# Patient Record
Sex: Female | Born: 1975 | Race: White | Hispanic: No | Marital: Single | Smoking: Current every day smoker
Health system: Southern US, Community
[De-identification: ages and names within clinical notes are randomized; demographics above are authoritative.]

## PROBLEM LIST (undated history)

## (undated) DIAGNOSIS — E282 Polycystic ovarian syndrome: Secondary | ICD-10-CM

## (undated) HISTORY — PX: REDUCTION MAMMAPLASTY: SUR839

## (undated) HISTORY — PX: BREAST REDUCTION SURGERY: SHX8

## (undated) HISTORY — DX: Polycystic ovarian syndrome: E28.2

---

## 2007-01-17 ENCOUNTER — Ambulatory Visit: Payer: Self-pay | Admitting: Internal Medicine

## 2007-01-19 ENCOUNTER — Ambulatory Visit: Payer: Self-pay | Admitting: Internal Medicine

## 2007-05-03 DIAGNOSIS — D239 Other benign neoplasm of skin, unspecified: Secondary | ICD-10-CM | POA: Insufficient documentation

## 2008-09-09 ENCOUNTER — Ambulatory Visit: Payer: Self-pay | Admitting: Internal Medicine

## 2008-10-16 ENCOUNTER — Ambulatory Visit: Payer: Self-pay | Admitting: Internal Medicine

## 2008-10-17 ENCOUNTER — Ambulatory Visit: Payer: Self-pay | Admitting: *Deleted

## 2011-03-19 ENCOUNTER — Other Ambulatory Visit (HOSPITAL_COMMUNITY)
Admission: RE | Admit: 2011-03-19 | Discharge: 2011-03-19 | Disposition: A | Discharge status: Home / Self Care | Payer: BC Managed Care – PPO | Source: Ambulatory Visit | Attending: Family Medicine | Admitting: Family Medicine

## 2011-03-19 ENCOUNTER — Other Ambulatory Visit: Payer: Self-pay | Admitting: Physician Assistant

## 2011-03-19 DIAGNOSIS — Z01419 Encounter for gynecological examination (general) (routine) without abnormal findings: Secondary | ICD-10-CM | POA: Insufficient documentation

## 2013-10-26 ENCOUNTER — Other Ambulatory Visit: Payer: Self-pay | Admitting: Physician Assistant

## 2013-10-26 ENCOUNTER — Other Ambulatory Visit (HOSPITAL_COMMUNITY)
Admission: RE | Admit: 2013-10-26 | Discharge: 2013-10-26 | Disposition: A | Discharge status: Home / Self Care | Payer: BC Managed Care – PPO | Source: Ambulatory Visit | Attending: Family Medicine | Admitting: Family Medicine

## 2013-10-26 DIAGNOSIS — Z0000 Encounter for general adult medical examination without abnormal findings: Secondary | ICD-10-CM | POA: Insufficient documentation

## 2013-11-05 ENCOUNTER — Encounter: Payer: BC Managed Care – PPO | Attending: Family Medicine | Admitting: Dietician

## 2013-11-05 ENCOUNTER — Encounter: Payer: Self-pay | Admitting: Dietician

## 2013-11-05 VITALS — Ht 70.0 in | Wt 279.2 lb

## 2013-11-05 DIAGNOSIS — Z713 Dietary counseling and surveillance: Secondary | ICD-10-CM | POA: Insufficient documentation

## 2013-11-05 DIAGNOSIS — Z6835 Body mass index (BMI) 35.0-35.9, adult: Secondary | ICD-10-CM | POA: Insufficient documentation

## 2013-11-05 DIAGNOSIS — E669 Obesity, unspecified: Secondary | ICD-10-CM | POA: Insufficient documentation

## 2013-11-05 DIAGNOSIS — E282 Polycystic ovarian syndrome: Secondary | ICD-10-CM | POA: Insufficient documentation

## 2013-11-05 NOTE — Progress Notes (Signed)
Medical Nutrition Therapy:  Appt start time: 1700 end time:  1800.  Assessment:  Primary concerns today: Obesity, PCOS, risk for type 2 DM.   Preferred Learning Style:   No preference indicated   Learning Readiness:   Contemplating  MEDICATIONS: see list.   DIETARY INTAKE: Usual eating pattern includes 3 meals and 1 snacks per day. Everyday foods include variety of fast food.  Avoided foods include beef, pork.    24-hr recall:  B ( AM): mcdonalds med coffee with 9 creams and 8 sugars; eggs, hash brown and biscuit. Or subway for breakfast Snk ( AM): none  L ( PM): Gyro and fries; canned soup- NE clam chowder common, including for breakfast; or skips. Water throughout the day Snk ( PM): none D ( PM): chinese takeout (fried chix and veg fried rice or veg lo mein) or zaxby's (wings and things meal or buffalo chix salad) or cookout (chix strips with french fries and onion rings, milkshake) or subway (foot long [place] on [place] bread with all vegs, mustard, cheese and 3 cookies). water Snk ( PM): reese's and a kitkat, water Beverages: coffee, water, occasional soda, sometimes juice or raspberry lemonade or limeade; EtOH 3 days per month 2-3 drinks  Usual physical activity: minimal. Has gym membership but does not attend. When at gym, warms up on treadmill then an exercise class or circuit training. No active hobbies noted. Will walk the park during summer only.   Progress Towards Goal(s):  In progress.   Nutritional Diagnosis:  NI-1.5 Excessive energy intake As related to low physical activity level, high kcal food choices.  As evidenced by BMI >35, pt diet and activity recall.    Intervention:  Nutrition counseling provided regarding making a variety of small alterations to usual habits to create weight loss. Topics included choosing lower kcal option when eating out, portion controlled and pre-prepared meals such as Healthy Choice, limiting kcal from drinks by adding whole milk to  coffee in favor of cream and sugar, and how to track eating habits, kcal intake using MyFitnessPal. RD assigned 1850 kcal diet with minimum 125 grams protein. RD also provided a reference list of best choices in high protein, high fat, and high CHO foods for heart health. RD recommended vitamin D supplement at 4000 IUs per day to prevent future deficiency.  RD also recommended exercise at 30 minutes per day of moderate intensity (to be determined by talk test), increasing every 2 weeks by 5 minutes per day until 60 minutes per day is achieved.   Teaching Method Utilized:  Visual Auditory  Handouts given during visit include:  Best Protein, Fat, and CHO foods  Barriers to learning/adherence to lifestyle change: previous lifestyle habits and dietary choices  Demonstrated degree of understanding via:  Teach Back   Monitoring/Evaluation:  Dietary intake, exercise, portion control, and body weight in 2 month(s).

## 2014-01-10 ENCOUNTER — Ambulatory Visit: Payer: BC Managed Care – PPO | Admitting: Dietician

## 2015-08-06 ENCOUNTER — Emergency Department (HOSPITAL_BASED_OUTPATIENT_CLINIC_OR_DEPARTMENT_OTHER)
Admission: EM | Admit: 2015-08-06 | Discharge: 2015-08-06 | Disposition: A | Discharge status: Home / Self Care | Payer: Worker's Compensation | Attending: Emergency Medicine | Admitting: Emergency Medicine

## 2015-08-06 ENCOUNTER — Encounter (HOSPITAL_BASED_OUTPATIENT_CLINIC_OR_DEPARTMENT_OTHER): Payer: Self-pay

## 2015-08-06 ENCOUNTER — Emergency Department (HOSPITAL_BASED_OUTPATIENT_CLINIC_OR_DEPARTMENT_OTHER): Payer: Worker's Compensation

## 2015-08-06 DIAGNOSIS — S9002XA Contusion of left ankle, initial encounter: Secondary | ICD-10-CM | POA: Insufficient documentation

## 2015-08-06 DIAGNOSIS — Y998 Other external cause status: Secondary | ICD-10-CM | POA: Insufficient documentation

## 2015-08-06 DIAGNOSIS — Z8639 Personal history of other endocrine, nutritional and metabolic disease: Secondary | ICD-10-CM | POA: Insufficient documentation

## 2015-08-06 DIAGNOSIS — S92355A Nondisplaced fracture of fifth metatarsal bone, left foot, initial encounter for closed fracture: Secondary | ICD-10-CM | POA: Diagnosis not present

## 2015-08-06 DIAGNOSIS — X500XXA Overexertion from strenuous movement or load, initial encounter: Secondary | ICD-10-CM | POA: Insufficient documentation

## 2015-08-06 DIAGNOSIS — Y9301 Activity, walking, marching and hiking: Secondary | ICD-10-CM | POA: Insufficient documentation

## 2015-08-06 DIAGNOSIS — T148XXA Other injury of unspecified body region, initial encounter: Secondary | ICD-10-CM

## 2015-08-06 DIAGNOSIS — S99922A Unspecified injury of left foot, initial encounter: Secondary | ICD-10-CM | POA: Diagnosis present

## 2015-08-06 DIAGNOSIS — Y9289 Other specified places as the place of occurrence of the external cause: Secondary | ICD-10-CM | POA: Insufficient documentation

## 2015-08-06 DIAGNOSIS — M7989 Other specified soft tissue disorders: Secondary | ICD-10-CM

## 2015-08-06 DIAGNOSIS — S92302A Fracture of unspecified metatarsal bone(s), left foot, initial encounter for closed fracture: Secondary | ICD-10-CM

## 2015-08-06 MED ORDER — NAPROXEN 500 MG PO TABS: 500.0000 mg | ORAL_TABLET | Freq: Two times a day (BID) | ORAL | Status: AC | PRN

## 2015-08-06 MED ORDER — HYDROCODONE-ACETAMINOPHEN 5-325 MG PO TABS: 1.0000 | ORAL_TABLET | Freq: Four times a day (QID) | ORAL | Status: AC | PRN

## 2015-08-06 NOTE — ED Provider Notes (Signed)
CSN: [id number]     Arrival date & time 08/06/15  1648 History   First MD Initiated Contact with Patient 08/06/15 1713     Chief Complaint  Patient presents with  . Foot Pain     (Consider location/radiation/quality/duration/timing/severity/associated sxs/prior Treatment) HPI Comments: Diane Griffin is a 40 y.o. female with a PMHx of PCOS, who presents to the ED with complaints of left foot and ankle injury sustained on Monday when she was walking and accidentally twisted her ankle inverting it. She describes the pain as 7/10 intermittent throbbing along the lateral ankle and foot, nonradiating, worse with palpation of the area and weightbearing, unrelieved with Epsom salt soaks and elevation, and mildly improved with Advil and ice. Associated symptoms include bruising and swelling. She denies any other injuries sustained, no head injury lost consciousness, no numbness, tingling, or focal weakness. No loss of range of motion in the digits, although it is painful. No abrasions or cuts. Prior injury to this ankle.  Patient is a 40 y.o. female presenting with lower extremity pain. The history is provided by the patient. No language interpreter was used.  Foot Pain This is a new problem. The current episode started in the past 7 days. The problem occurs intermittently. The problem has been gradually worsening. Associated symptoms include arthralgias (L foot/ankle) and joint swelling. Pertinent negatives include no numbness or weakness. The symptoms are aggravated by walking. She has tried ice, NSAIDs and position changes for the symptoms. The treatment provided mild relief.    Past Medical History  Diagnosis Date  . Polycystic ovarian syndrome    Past Surgical History  Procedure Laterality Date  . Breast reduction surgery     Family History  Problem Relation Age of Onset  . Diabetes Father   . Heart disease Maternal Grandfather    Social History  Substance Use Topics  . Smoking status:  Never Smoker   . Smokeless tobacco: Never Used  . Alcohol Use: Yes     Comment: occ   OB History    No data available     Review of Systems  HENT: Negative for facial swelling (no head inj).   Musculoskeletal: Positive for joint swelling and arthralgias (L foot/ankle).  Skin: Positive for color change. Negative for wound.  Allergic/Immunologic: Negative for immunocompromised state.  Neurological: Negative for syncope, weakness and numbness.   10 Systems reviewed and are negative for acute change except as noted in the HPI.    Allergies  Review of patient's allergies indicates no known allergies.  Home Medications   Prior to Admission medications   Medication Sig Start Date End Date Taking? Authorizing Provider  ibuprofen (ADVIL,MOTRIN) 200 MG tablet Take 200 mg by mouth every 6 (six) hours as needed.   Yes Historical Provider, MD   BP 110/62 mmHg  Pulse 82  Temp(Src) 98.3 F (36.8 C) (Oral)  Resp 18  Ht 5\' 10"  (1.778 m)  Wt 84.369 kg  BMI 26.69 kg/m2  SpO2 96%  LMP 07/19/2014 Physical Exam  Constitutional: She is oriented to person, place, and time. Vital signs are normal. She appears well-developed and well-nourished.  Non-toxic appearance. No distress.  Afebrile, nontoxic, NAD  HENT:  Head: Normocephalic and atraumatic.  Mouth/Throat: Mucous membranes are normal.  Eyes: Conjunctivae and EOM are normal. Right eye exhibits no discharge. Left eye exhibits no discharge.  Neck: Normal range of motion. Neck supple.  Cardiovascular: Normal rate and intact distal pulses.   Pulmonary/Chest: Effort normal. No respiratory distress.  Abdominal: Normal appearance. She exhibits no distension.  Musculoskeletal: Normal range of motion.       Left ankle: She exhibits swelling and ecchymosis. She exhibits normal range of motion, no deformity, no laceration and normal pulse. Tenderness. Lateral malleolus tenderness found. Achilles tendon normal.       Feet:  L ankle with FROM  intact, +swelling and bruising to lateral malleolus and lateral forefoot, no deformity, with moderate TTP of lateral malleolus and lateral forefoot but no TTP or swelling of calf. No break in skin. No erythema. No warmth. Achilles intact. Good pedal pulse and cap refill of all toes. Wiggling toes without difficulty, although this is painful. Sensation grossly intact.   Neurological: She is alert and oriented to person, place, and time. She has normal strength. No sensory deficit.  Skin: Skin is warm, dry and intact. No rash noted.  Psychiatric: She has a normal mood and affect. Her behavior is normal.  Nursing note and vitals reviewed.   ED Course  Procedures (including critical care time) Labs Review Labs Reviewed - No data to display  Imaging Review Dg Ankle Complete Left  08/06/2015  CLINICAL DATA:  Fall.  Twisted left ankle. EXAM: LEFT ANKLE COMPLETE - 3+ VIEW COMPARISON:  08/06/2015 FINDINGS: Diffuse soft tissue swelling is identified. There is an acute comminuted intra-articular fracture which involves the base of the fifth metatarsal bone. The fracture fragments are in near anatomic alignment. IMPRESSION: 1. Acute fracture involves the base of the fifth metatarsal bone. Electronically Signed   By: Kerby Moors M.D.   On: 08/06/2015 17:36   Dg Foot Complete Left  08/06/2015  CLINICAL DATA:  40 year old female with history of trauma from a fall yesterday injuring the left foot complaining of pain over the fifth metatarsal region. EXAM: LEFT FOOT - COMPLETE 3+ VIEW COMPARISON:  No priors. FINDINGS: Three views of the left foot demonstrate a nondisplaced fracture through the base of the fifth metatarsal. Overlying soft tissues are swollen. IMPRESSION: 1. Nondisplaced fracture through the base of the fifth metatarsal. Electronically Signed   By: Vinnie Langton M.D.   On: 08/06/2015 17:41   I have personally reviewed and evaluated these images and lab results as part of my medical  decision-making.   EKG Interpretation None      MDM   Final diagnoses:  Metatarsal fracture, left, closed, initial encounter  Foot swelling  Bruising    40 y.o. female here with L ankle/foot pain s/p twisting it 2 days ago. +bruising and swelling. NVI with soft compartments. ROM in digits preserved although painful. Will await xray imaging, pt declines pain meds. Will reassess shortly  6:38 PM  Xray reveals metatarsal fx at base of 5th metatarsal, comminuted and intraarticular but near perfect alignment. Will place in cam walker, give crutches for all weight bearing, and have her f/up with ortho in 1wk. RICE discussed. Will give pain meds. I explained the diagnosis and have given explicit precautions to return to the ER including for any other new or worsening symptoms. The patient understands and accepts the medical plan as it's been dictated and I have answered their questions. Discharge instructions concerning home care and prescriptions have been given. The patient is STABLE and is discharged to home in good condition.   BP 110/62 mmHg  Pulse 82  Temp(Src) 98.3 F (36.8 C) (Oral)  Resp 18  Ht 5\' 10"  (1.778 m)  Wt 84.369 kg  BMI 26.69 kg/m2  SpO2 96%  LMP 07/19/2014  Meds  ordered this encounter  Medications  . HYDROcodone-acetaminophen (NORCO) 5-325 MG tablet    Sig: Take 1 tablet by mouth every 6 (six) hours as needed for severe pain.    Dispense:  20 tablet    Refill:  0    Order Specific Question:  Supervising Provider    Answer:  MILLER, BRIAN [[id number]]  . naproxen (NAPROSYN) 500 MG tablet    Sig: Take 1 tablet (500 mg total) by mouth 2 (two) times daily as needed for mild pain, moderate pain or headache (TAKE WITH MEALS.).    Dispense:  20 tablet    Refill:  0    Order Specific Question:  Supervising Provider    Answer:  Noemi Chapel [[id number]]       Kanika Bungert Camprubi-Soms, PA-C 08/06/15 1839  Tanna Furry, MD 08/09/15 [phone]

## 2015-08-06 NOTE — ED Notes (Signed)
Was at meeting at work-stood-ankle/foot twisted and fell-pain to left foot and ankle-limping gait into triage

## 2015-08-06 NOTE — ED Notes (Signed)
Pa  at bedside. 

## 2015-08-06 NOTE — ED Notes (Signed)
Patient transported to X-ray 

## 2015-08-06 NOTE — Discharge Instructions (Signed)
Wear cam walker at all times, and Use crutches for all weight bearing activities. Ice and elevate foot throughout the day. Alternate between naprosyn and norco for pain relief. Do not drive or operate machinery with pain medication use. Call orthopedic follow up today or tomorrow to schedule followup appointment for ongoing management of your foot fracture in the next 5-7 days. Return to the ER for changes or worsening symptoms.    Metatarsal Fracture A metatarsal fracture is a break in a metatarsal bone. Metatarsal bones connect your toe bones to your ankle bones. CAUSES This type of fracture may be caused by:  A sudden twisting of your foot.  A fall onto your foot.  Overuse or repetitive exercise. RISK FACTORS This condition is more likely to develop in people who:  Play contact sports.  Have a bone disease.  Have a low calcium level. SYMPTOMS Symptoms of this condition include:  Pain that is worse when walking or standing.  Pain when pressing on the foot or moving the toes.  Swelling.  Bruising on the top or bottom of the foot.  A foot that appears shorter than the other one. DIAGNOSIS This condition is diagnosed with a physical exam. You may also have imaging tests, such as:  X-rays.  A CT scan.  MRI. TREATMENT Treatment for this condition depends on its severity and whether a bone has moved out of place. Treatment may involve:  Rest.  Wearing foot support such as a cast, splint, or boot for several weeks.  Using crutches.  Surgery to move bones back into the right position. Surgery is usually needed if there are many pieces of broken bone or bones that are very out of place (displaced fracture).  Physical therapy. This may be needed to help you regain full movement and strength in your foot. You will need to return to your health care provider to have X-rays taken until your bones heal. Your health care provider will look at the X-rays to make sure that your  foot is healing well. HOME CARE INSTRUCTIONS  If You Have a Cast:  Do not stick anything inside the cast to scratch your skin. Doing that increases your risk of infection.  Check the skin around the cast every day. Report any concerns to your health care provider. You may put lotion on dry skin around the edges of the cast. Do not apply lotion to the skin underneath the cast.  Keep the cast clean and dry. If You Have a Splint or a Supportive Boot:  Wear it as directed by your health care provider. Remove it only as directed by your health care provider.  Loosen it if your toes become numb and tingle, or if they turn cold and blue.  Keep it clean and dry. Bathing  Do not take baths, swim, or use a hot tub until your health care provider approves. Ask your health care provider if you can take showers. You may only be allowed to take sponge baths for bathing.  If your health care provider approves bathing and showering, cover the cast or splint with a watertight plastic bag to protect it from water. Do not let the cast or splint get wet. Managing Pain, Stiffness, and Swelling  If directed, apply ice to the injured area (if you have a splint, not a cast).  Put ice in a plastic bag.  Place a towel between your skin and the bag.  Leave the ice on for 20 minutes, 2-3 times per  day.  Move your toes often to avoid stiffness and to lessen swelling.  Raise (elevate) the injured area above the level of your heart while you are sitting or lying down. Driving  Do not drive or operate heavy machinery while taking pain medicine.  Do not drive while wearing foot support on a foot that you use for driving. Activity  Return to your normal activities as directed by your health care provider. Ask your health care provider what activities are safe for you.  Perform exercises as directed by your health care provider or physical therapist. Safety  Do not use the injured foot to support your  body weight until your health care provider says that you can. Use crutches as directed by your health care provider. General Instructions  Do not put pressure on any part of the cast or splint until it is fully hardened. This may take several hours.  Do not use any tobacco products, including cigarettes, chewing tobacco, or e-cigarettes. Tobacco can delay bone healing. If you need help quitting, ask your health care provider.  Take medicines only as directed by your health care provider.  Keep all follow-up visits as directed by your health care provider. This is important. SEEK MEDICAL CARE IF:  You have a fever.  Your cast, splint, or boot is too loose or too tight.  Your cast, splint, or boot is damaged.  Your pain medicine is not helping.  You have pain, tingling, or numbness in your foot that is not going away. SEEK IMMEDIATE MEDICAL CARE IF:  You have severe pain.  You have tingling or numbness in your foot that is getting worse.  Your foot feels cold or becomes numb.  Your foot changes color.   This information is not intended to replace advice given to you by your health care provider. Make sure you discuss any questions you have with your health care provider.   Document Released: 03/13/2002 Document Revised: 11/05/2014 Document Reviewed: 04/17/2014 Elsevier Interactive Patient Education [employer].

## 2016-07-06 ENCOUNTER — Encounter (HOSPITAL_COMMUNITY): Payer: Self-pay | Admitting: Emergency Medicine

## 2016-07-06 ENCOUNTER — Emergency Department (HOSPITAL_COMMUNITY)
Admission: EM | Admit: 2016-07-06 | Discharge: 2016-07-06 | Disposition: A | Discharge status: Home / Self Care | Payer: BC Managed Care – PPO | Attending: Emergency Medicine | Admitting: Emergency Medicine

## 2016-07-06 DIAGNOSIS — Z79899 Other long term (current) drug therapy: Secondary | ICD-10-CM | POA: Insufficient documentation

## 2016-07-06 DIAGNOSIS — M5441 Lumbago with sciatica, right side: Secondary | ICD-10-CM

## 2016-07-06 MED ORDER — CYCLOBENZAPRINE HCL 10 MG PO TABS: 10.0000 mg | ORAL_TABLET | Freq: Two times a day (BID) | ORAL | 0 refills | Status: AC | PRN

## 2016-07-06 NOTE — ED Triage Notes (Signed)
Per pt, states lower back pain for a couple of days-states she thinks she sat sown wrong a couple of weeks ago-no dysuria

## 2016-07-06 NOTE — ED Provider Notes (Signed)
[facility] DEPT Provider Note   CSN: [id number] Arrival date & time: 07/06/16  1707     History   Chief Complaint Chief Complaint  Patient presents with  . Back Pain    HPI Diane Griffin is a 41 y.o. female.  HPI   41 year old female presents today with back pain. Patient reports on and off back pain for several years. She notes roughly 2 weeks ago she started having lower midline lumbar back pain. She notes this was worse after prolonged periods of sitting. 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 Symptoms began to improve but several days ago again worsen. She describes this as an ache in her lower back with intermittent radiation into her right buttock. Patient notes she took Aleve this morning. Patient denies any lower extremity neurological strength deficits, denies any saddle anesthesia, IV drug use, fever, abdominal pain, or any other concerning signs or symptoms.  Past Medical History:  Diagnosis Date  . Polycystic ovarian syndrome     Patient Active Problem List   Diagnosis Date Noted  . PCOS (polycystic ovarian syndrome) 11/05/2013  . Obesity, unspecified 11/05/2013  . MOLE 05/03/2007    Past Surgical History:  Procedure Laterality Date  . BREAST REDUCTION SURGERY      OB History    No data available       Home Medications    Prior to Admission medications   Medication Sig Start Date End Date Taking? Authorizing Provider  cyclobenzaprine (FLEXERIL) 10 MG tablet Take 1 tablet (10 mg total) by mouth 2 (two) times daily as needed for muscle spasms. 07/06/16   Okey Regal, PA-C  HYDROcodone-acetaminophen (NORCO) 5-325 MG tablet Take 1 tablet by mouth every 6 (six) hours as needed for severe pain. 08/06/15   Mercedes Camprubi-Soms, PA-C  ibuprofen (ADVIL,MOTRIN) 200 MG tablet Take 200 mg by mouth every 6 (six) hours as needed.    Historical Provider, MD  naproxen (NAPROSYN) 500 MG tablet Take 1 tablet (500 mg total) by mouth 2 (two)  times daily as needed for mild pain, moderate pain or headache (TAKE WITH MEALS.). 08/06/15   Mercedes Camprubi-Soms, PA-C    Family History Family History  Problem Relation Age of Onset  . Diabetes Father   . Heart disease Maternal Grandfather     Social History Social History  Substance Use Topics  . Smoking status: Never Smoker  . Smokeless tobacco: Never Used  . Alcohol use Yes     Comment: occ     Allergies   Patient has no known allergies.   Review of Systems Review of Systems  All other systems reviewed and are negative.    Physical Exam Updated Vital Signs BP 121/65 (BP Location: Left Arm)   Pulse 80   Temp 98.9 F (37.2 C) (Oral)   Resp 18   LMP 06/16/2016   SpO2 99%   Physical Exam  Constitutional: She is oriented to person, place, and time. She appears well-developed and well-nourished. No distress.  HENT:  Head: Normocephalic and atraumatic.  Eyes: Conjunctivae are normal. Pupils are equal, round, and reactive to light. Right eye exhibits no discharge. Left eye exhibits no discharge. No scleral icterus.  Neck: Normal range of motion. Neck supple. No JVD present. No tracheal deviation present.  Pulmonary/Chest: Effort normal. No stridor.  Musculoskeletal: Normal range of motion. She exhibits tenderness. She exhibits no edema.  No C, T, or L spine tenderness to palpation. No obvious signs of trauma, deformity, infection,  step-offs. Lung expansion normal. No scoliosis or kyphosis. Bilateral lower extremity strength 5 out of 5, sensation grossly intact  Straight leg negative  Very minimal tenderness to palpation of lumbar soft tissue   Neurological: She is alert and oriented to person, place, and time. Coordination normal.  Skin: Skin is warm and dry. She is not diaphoretic.  Psychiatric: She has a normal mood and affect. Her behavior is normal. Judgment and thought content normal.  Nursing note and vitals reviewed.    ED Treatments / Results   Labs (all labs ordered are listed, but only abnormal results are displayed) Labs Reviewed - No data to display  EKG  EKG Interpretation None       Radiology No results found.  Procedures Procedures (including critical care time)  Medications Ordered in ED Medications - No data to display   Initial Impression / Assessment and Plan / ED Course  I have reviewed the triage vital signs and the nursing notes.  Pertinent labs & imaging results that were available during my care of the patient were reviewed by me and considered in my medical decision making (see chart for details).  Clinical Course     41 year old female presents today with a complicated back pain. Patient has no red flags, she'll be instructed to use ibuprofen and Tylenol, rest, muscle relaxer as needed for spasms. She is instructed to follow up with orthopedics if symptoms persist, return to the emergency room immediately if any new or worsening signs or symptoms present. She verbalized understanding and agreement to today's plan.  Final Clinical Impressions(s) / ED Diagnoses   Final diagnoses:  Acute midline low back pain with right-sided sciatica    New Prescriptions New Prescriptions   CYCLOBENZAPRINE (FLEXERIL) 10 MG TABLET    Take 1 tablet (10 mg total) by mouth 2 (two) times daily as needed for muscle spasms.     Okey Regal, PA-C 07/06/16 Merlin, MD 07/07/16 [phone]

## 2016-07-06 NOTE — Discharge Instructions (Signed)
Please read attached information. If you experience any new or worsening signs or symptoms please return to the emergency room for evaluation. Please follow-up with your primary care provider or specialist as discussed. Please use medication prescribed only as directed and discontinue taking if you have any concerning signs or symptoms.   °

## 2019-08-24 ENCOUNTER — Encounter (HOSPITAL_BASED_OUTPATIENT_CLINIC_OR_DEPARTMENT_OTHER): Payer: Self-pay | Admitting: *Deleted

## 2019-08-24 ENCOUNTER — Emergency Department (HOSPITAL_BASED_OUTPATIENT_CLINIC_OR_DEPARTMENT_OTHER)
Admission: EM | Admit: 2019-08-24 | Discharge: 2019-08-24 | Disposition: A | Discharge status: Home / Self Care | Payer: Self-pay | Attending: Emergency Medicine | Admitting: Emergency Medicine

## 2019-08-24 ENCOUNTER — Other Ambulatory Visit: Payer: Self-pay

## 2019-08-24 ENCOUNTER — Emergency Department (HOSPITAL_BASED_OUTPATIENT_CLINIC_OR_DEPARTMENT_OTHER): Payer: Self-pay

## 2019-08-24 DIAGNOSIS — F17290 Nicotine dependence, other tobacco product, uncomplicated: Secondary | ICD-10-CM | POA: Insufficient documentation

## 2019-08-24 DIAGNOSIS — L989 Disorder of the skin and subcutaneous tissue, unspecified: Secondary | ICD-10-CM | POA: Insufficient documentation

## 2019-08-24 DIAGNOSIS — R229 Localized swelling, mass and lump, unspecified: Secondary | ICD-10-CM

## 2019-08-24 DIAGNOSIS — E282 Polycystic ovarian syndrome: Secondary | ICD-10-CM | POA: Insufficient documentation

## 2019-08-24 LAB — CBC WITH DIFFERENTIAL/PLATELET
Abs Immature Granulocytes: 0.02 K/uL (ref 0.00–0.07)
Basophils Absolute: 0.1 K/uL (ref 0.0–0.1)
Basophils Relative: 1 %
Eosinophils Absolute: 0.5 K/uL (ref 0.0–0.5)
Eosinophils Relative: 5 %
HCT: 40.9 % (ref 36.0–46.0)
Hemoglobin: 13.8 g/dL (ref 12.0–15.0)
Immature Granulocytes: 0 %
Lymphocytes Relative: 27 %
Lymphs Abs: 2.7 K/uL (ref 0.7–4.0)
MCH: 28.7 pg (ref 26.0–34.0)
MCHC: 33.7 g/dL (ref 30.0–36.0)
MCV: 85 fL (ref 80.0–100.0)
Monocytes Absolute: 0.8 K/uL (ref 0.1–1.0)
Monocytes Relative: 8 %
Neutro Abs: 6.2 K/uL (ref 1.7–7.7)
Neutrophils Relative %: 59 %
Platelets: 166 K/uL (ref 150–400)
RBC: 4.81 MIL/uL (ref 3.87–5.11)
RDW: 14.9 % (ref 11.5–15.5)
WBC: 10.3 K/uL (ref 4.0–10.5)
nRBC: 0 % (ref 0.0–0.2)

## 2019-08-24 LAB — BASIC METABOLIC PANEL
Anion gap: 8 (ref 5–15)
BUN: 10 mg/dL (ref 6–20)
CO2: 26 mmol/L (ref 22–32)
Calcium: 9.5 mg/dL (ref 8.9–10.3)
Chloride: 102 mmol/L (ref 98–111)
Creatinine, Ser: 0.86 mg/dL (ref 0.44–1.00)
GFR calc Af Amer: >60 mL/min (ref >60)
GFR calc non Af Amer: >60 mL/min (ref >60)
Glucose, Bld: 88 mg/dL (ref 70–99)
Potassium: 3.3 mmol/L — ABNORMAL LOW (ref 3.5–5.1)
Sodium: 136 mmol/L (ref 135–145)

## 2019-08-24 LAB — PREGNANCY, URINE: Preg Test, Ur: NEGATIVE

## 2019-08-24 MED ORDER — IOHEXOL 300 MG/ML  SOLN
100.0000 mL | Freq: Once | INTRAMUSCULAR | Status: AC | PRN
  Administered 2019-08-24: 80 mL via INTRAVENOUS

## 2019-08-24 NOTE — Discharge Instructions (Signed)
As we discussed, the CT of your chest was reassuring.  This appears to be an area of irritation under the skin.  No abnormalities of the chest CT.  As we discussed, you will need to establish a primary care doctor.  Follow-up with [facility].  Return the emergency department for any redness of swelling of the area, fevers, chest pain, difficulty breathing.

## 2019-08-24 NOTE — ED Triage Notes (Signed)
Knot in the right side of her chest. She felt it a month ago. She has had a cough on and off. She has smoked x 5 years.

## 2019-08-24 NOTE — ED Provider Notes (Signed)
[facility] EMERGENCY DEPARTMENT Provider Note   CSN: [id number] Arrival date & time: 08/24/19  1401     History Chief Complaint  Patient presents with  . Knot in her chest    Diane Griffin is a 44 y.o. female past medical history of PCOS, obesity who presents for evaluation of concern for a knot to the right side of her chest that has been there for about 6 weeks.  She states that she first noticed it about 6 weeks ago and had a little bit of pain to the area.  Then she felt like area developed a knot/area of swelling.  No warmth or erythema.  She states that the pain resolved but the area continued to remain there.  She states it has not gotten bigger, worsen or change in any way but she felt like she needed to get it evaluated.  She does smoke cigarillos and estimates she smokes about 5-6 a day.  She has not had any fevers, chest pain, difficulty breathing, weight loss, night sweats.  The history is provided by the patient.       Past Medical History:  Diagnosis Date  . Polycystic ovarian syndrome     Patient Active Problem List   Diagnosis Date Noted  . PCOS (polycystic ovarian syndrome) 11/05/2013  . Obesity, unspecified 11/05/2013  . MOLE 05/03/2007    Past Surgical History:  Procedure Laterality Date  . BREAST REDUCTION SURGERY       OB History   No obstetric history on file.     Family History  Problem Relation Age of Onset  . Diabetes Father   . Heart disease Maternal Grandfather     Social History   Tobacco Use  . Smoking status: Current Every Day Smoker  . Smokeless tobacco: Never Used  Substance Use Topics  . Alcohol use: Yes    Comment: occ  . Drug use: No    Home Medications Prior to Admission medications   Medication Sig Start Date End Date Taking? Authorizing Provider  cyclobenzaprine (FLEXERIL) 10 MG tablet Take 1 tablet (10 mg total) by mouth 2 (two) times daily as needed for muscle spasms. 07/06/16   Hedges, Dellis Filbert, PA-C    HYDROcodone-acetaminophen (NORCO) 5-325 MG tablet Take 1 tablet by mouth every 6 (six) hours as needed for severe pain. 08/06/15   Street, [place], PA-C  ibuprofen (ADVIL,MOTRIN) 200 MG tablet Take 200 mg by mouth every 6 (six) hours as needed.    [provider]  naproxen (NAPROSYN) 500 MG tablet Take 1 tablet (500 mg total) by mouth 2 (two) times daily as needed for mild pain, moderate pain or headache (TAKE WITH MEALS.). 08/06/15   Street, [place], [place]    Allergies    Patient has no known allergies.  Review of Systems   Review of Systems  Constitutional: Negative for diaphoresis, fever and unexpected weight change.  Respiratory: Negative for shortness of breath.   Cardiovascular: Negative for chest pain.  Skin: Negative for color change.  All other systems reviewed and are negative.   Physical Exam Updated Vital Signs BP 119/80 (BP Location: Right Arm)   Pulse 63   Temp 98.2 F (36.8 C) (Oral)   Resp 16   Ht 5\' 10"  (1.778 m)   Wt 89.8 kg   LMP 08/03/2019   SpO2 100%   BMI 28.41 kg/m   Physical Exam Vitals and nursing note reviewed.  Constitutional:      Appearance: She is well-developed.  HENT:  Head: Normocephalic and atraumatic.  Eyes:     General: No scleral icterus.       Right eye: No discharge.        Left eye: No discharge.     Conjunctiva/sclera: Conjunctivae normal.  Pulmonary:     Effort: Pulmonary effort is normal.  Chest:       Comments: Small 3 cm area of swelling that is movable and non-tender. No overlying warmth, erythema.  Lymphadenopathy:     Cervical: No cervical adenopathy.     Upper Body:     Right upper body: No supraclavicular or axillary adenopathy.     Left upper body: No supraclavicular or axillary adenopathy.  Skin:    General: Skin is warm and dry.  Neurological:     Mental Status: She is alert.  Psychiatric:        Speech: Speech normal.        Behavior: Behavior normal.     ED Results / Procedures /  Treatments   Labs (all labs ordered are listed, but only abnormal results are displayed) Labs Reviewed  BASIC METABOLIC PANEL - Abnormal; Notable for the following components:      Result Value   Potassium 3.3 (*)    All other components within normal limits  PREGNANCY, URINE  CBC WITH DIFFERENTIAL/PLATELET    EKG None  Radiology DG Chest 2 View  Result Date: 08/24/2019 CLINICAL DATA:  Right chest lesion, not around right [place] joint. Cough. EXAM: CHEST - 2 VIEW COMPARISON:  No prior. FINDINGS: Mediastinum hilar structures normal. Low lung volumes. No focal alveolar infiltrate. No pleural effusion or pneumothorax. No acute bony abnormality identified. Sternoclavicular regions appear normal. If further evaluation is needed chest wall MRI can be obtained. IMPRESSION: No acute cardiopulmonary disease. No focal chest wall abnormality identified. Sternoclavicular joints appear normal. Further evaluation of the chest wall with MRI can be obtained as needed. Electronically Signed   By: Marcello Moores  Register   On: 08/24/2019 14:44   CT Chest W Contrast  Result Date: 08/24/2019 CLINICAL DATA:  44 year old female with palpable abnormality of the upper right chest near midline x6 weeks. Initially painful but pain now resolved. EXAM: CT CHEST WITH CONTRAST TECHNIQUE: Multidetector CT imaging of the chest was performed during intravenous contrast administration. CONTRAST:  50mL OMNIPAQUE IOHEXOL 300 MG/ML  SOLN COMPARISON:  Chest radiographs earlier today. FINDINGS: Cardiovascular: No cardiomegaly or pericardial effusion. Negative thoracic aorta. No calcified coronary artery atherosclerosis is evident. Proximal great vessels appear unremarkable. Mediastinum/Nodes: Deep soft tissues at the thoracic inlet appear normal. Negative mediastinum. No lymphadenopathy. Lungs/Pleura: Major airways are patent. Negative lungs. No pleural effusion. Upper Abdomen: Negative visible liver, gallbladder, spleen, pancreas, adrenal  glands or kidneys. Negative visible bowel. Musculoskeletal: Bone mineralization appears within normal limits. No thoracic spine or rib abnormality identified. The area of clinical concern is marked in the medial supraclavicular area overlying the superior-medial margin of the pectoralis muscle (series 2, image 23). Underlying the skin marker there is very slightly asymmetric subcutaneous fat stranding (same image), but no abnormality of the regional muscles. No mass or fluid collection. The nearby right clavicle and sternoclavicular joint are within normal limits. No regional lymphadenopathy. Elsewhere the chest wall and superficial soft tissues appear within normal limits. IMPRESSION: 1. Only minimal asymmetric subcutaneous stranding at the marked area of clinical concern along the superior-medial margin of the right pectoralis muscle is noted, nonspecific. No discrete mass or regional musculoskeletal abnormality. 2. Negative Chest CT otherwise. Electronically Signed  By: Genevie Ann M.D.   On: 08/24/2019 17:10    Procedures Procedures (including critical care time)  Medications Ordered in ED Medications  iohexol (OMNIPAQUE) 300 MG/ML solution 100 mL (80 mLs Intravenous Contrast Given 08/24/19 1644)    ED Course  I have reviewed the triage vital signs and the nursing notes.  Pertinent labs & imaging results that were available during my care of the patient were reviewed by me and considered in my medical decision making (see chart for details).    MDM Rules/Calculators/A&P                      44 year old female who presents for evaluation of swelling to the right chest that has been on there for about 6 weeks.  She states initially, the area hurt but states that the pain has resolved but the swelling is continued.  No fevers, night sweats, weight loss, chest pain, difficulty breathing.  She does smoke.  On initially arrival, she is afebrile, nontoxic-appearing.  Vital signs are stable.  On exam,  she has a 3 cm area of swelling noted to the right anterior chest just above the sternal notch.  No overlying warmth, erythema or signs of infection.  No lymphadenopathy noted to the supraclavicular, axilla, cervical area.  Lungs clear to auscultation bilaterally.  Chest x-ray ordered at triage.  At this time, question if this is lymphadenopathy versus subcutaneous swelling.  Will plan for labs, CT chest with contrast.  BMP is unremarkable.  CBC without significant leukocytosis or anemia.  Urine pregnancy negative.  Chest x-ray shows no acute abnormality.  Chest CT shows minimal asymmetric subcutaneous stranding at the marked area of clinical concern along the margin of the right pectoralis muscle noted.  This is nonspecific.  No obvious mass or regional musculoskeletal abnormality.  Otherwise negative CT.  Updated patient on results.  Instructed her to establish primary care and follow-up with them.  Encouraged at home supportive care measures. At this time, patient exhibits no emergent life-threatening condition that require further evaluation in ED or admission. Patient had ample opportunity for questions and discussion. All patient's questions were answered with full understanding. Strict return precautions discussed. Patient expresses understanding and agreement to plan.   Portions of this note were generated with [employer]. Dictation errors may occur despite best attempts at proofreading.  Final Clinical Impression(s) / ED Diagnoses Final diagnoses:  Skin nodule    Rx / DC Orders ED Discharge Orders    None       Desma Mcgregor 08/24/19 1740    Isla Pence, MD 08/24/19 1840

## 2020-04-04 ENCOUNTER — Emergency Department (HOSPITAL_COMMUNITY)
Admission: EM | Admit: 2020-04-04 | Discharge: 2020-04-05 | Disposition: A | Discharge status: Home / Self Care | Payer: Self-pay | Attending: Emergency Medicine | Admitting: Emergency Medicine

## 2020-04-04 ENCOUNTER — Other Ambulatory Visit: Payer: Self-pay

## 2020-04-04 ENCOUNTER — Encounter (HOSPITAL_COMMUNITY): Payer: Self-pay | Admitting: Emergency Medicine

## 2020-04-04 DIAGNOSIS — Z4800 Encounter for change or removal of nonsurgical wound dressing: Secondary | ICD-10-CM | POA: Insufficient documentation

## 2020-04-04 DIAGNOSIS — B070 Plantar wart: Secondary | ICD-10-CM | POA: Insufficient documentation

## 2020-04-04 DIAGNOSIS — F17200 Nicotine dependence, unspecified, uncomplicated: Secondary | ICD-10-CM | POA: Insufficient documentation

## 2020-04-04 DIAGNOSIS — L84 Corns and callosities: Secondary | ICD-10-CM | POA: Insufficient documentation

## 2020-04-04 NOTE — ED Triage Notes (Signed)
Pt c/o pain to bilat feet d/t calluses/wounds to soles since June of this year.  Pt states pain and new wounds appearing. No drainage.

## 2020-04-05 MED ORDER — BUPIVACAINE-EPINEPHRINE (PF) 0.25% -1:200000 IJ SOLN
10.0000 mL | Freq: Once | INTRAMUSCULAR | Status: DC
  Filled 2020-04-05: qty 10

## 2020-04-05 NOTE — Discharge Instructions (Signed)
Use 46% salicylic acid pads (dr. Felicie Morn, mediplast) on the areas after soaking and drying the foot every 48 hours for up to 12 weeks.

## 2020-04-05 NOTE — ED Provider Notes (Signed)
[facility] EMERGENCY DEPARTMENT Provider Note   CSN: [id number] Arrival date & time: 04/04/20  1821     History Chief Complaint  Patient presents with  . Wound Check    Diane Griffin is a 44 y.o. female who presents with cc of Left foot pain. She complains of multiple painful bumps on her left foot. She believes they are calluses. She denies fever or chills.   HPI     Past Medical History:  Diagnosis Date  . Polycystic ovarian syndrome     Patient Active Problem List   Diagnosis Date Noted  . PCOS (polycystic ovarian syndrome) 11/05/2013  . Obesity, unspecified 11/05/2013  . MOLE 05/03/2007    Past Surgical History:  Procedure Laterality Date  . BREAST REDUCTION SURGERY       OB History   No obstetric history on file.     Family History  Problem Relation Age of Onset  . Diabetes Father   . Heart disease Maternal Grandfather     Social History   Tobacco Use  . Smoking status: Current Every Day Smoker  . Smokeless tobacco: Never Used  Substance Use Topics  . Alcohol use: Yes    Comment: occ  . Drug use: No    Home Medications Prior to Admission medications   Medication Sig Start Date End Date Taking? Authorizing Provider  cyclobenzaprine (FLEXERIL) 10 MG tablet Take 1 tablet (10 mg total) by mouth 2 (two) times daily as needed for muscle spasms. 07/06/16   Hedges, Dellis Filbert, PA-C  HYDROcodone-acetaminophen (NORCO) 5-325 MG tablet Take 1 tablet by mouth every 6 (six) hours as needed for severe pain. 08/06/15   Street, [place], PA-C  ibuprofen (ADVIL,MOTRIN) 200 MG tablet Take 200 mg by mouth every 6 (six) hours as needed.    [provider]  naproxen (NAPROSYN) 500 MG tablet Take 1 tablet (500 mg total) by mouth 2 (two) times daily as needed for mild pain, moderate pain or headache (TAKE WITH MEALS.). 08/06/15   Street, [place], [place]    Allergies    Patient has no known allergies.  Review of Systems   Review of Systems    Constitutional: Negative for fatigue and fever.  Musculoskeletal: Positive for gait problem.    Physical Exam Updated Vital Signs BP 120/78   Pulse (!) 57   Temp 97.9 F (36.6 C) (Oral)   Resp 16   SpO2 100%   Physical Exam  ED Results / Procedures / Treatments   Labs (all labs ordered are listed, but only abnormal results are displayed) Labs Reviewed - No data to display  EKG None  Radiology No results found.  Procedures Procedures (including critical care time)  Medications Ordered in ED Medications  bupivacaine-epinephrine (MARCAINE W/ EPI) 0.25% -1:200000 injection 10 mL (has no administration in time range)    ED Course  I have reviewed the triage vital signs and the nursing notes.  Pertinent labs & imaging results that were available during my care of the patient were reviewed by me and considered in my medical decision making (see chart for details).    MDM Rules/Calculators/A&P                           44 year old female here with complaint of foot pain.  She has callus versus plantar warts in multiple locations of left foot.  Patient's foot was soaked.  I injected the left foot with 3 mL of 0.25%  Marcaine with an epinephrine and was able to gently, layer by layer began to excise and cut down the hypertrophied callus over the ball of the foot.  This provided some relief immediately as well as with applying pressure to the foot.  Patient advised to use 24% salicylic acid pads on the regions once every 48 hours for 12 weeks.  Follow-up with podiatry.  She appears otherwise appropriate for discharge at this time  Final Clinical Impression(s) / ED Diagnoses Final diagnoses:  Corn of foot  Plantar wart of left foot    Rx / DC Orders ED Discharge Orders    None       Margarita Mail, PA-C 04/05/20 1128    Carmin Muskrat, MD 04/06/20 [phone]

## 2020-04-06 ENCOUNTER — Emergency Department (HOSPITAL_COMMUNITY)
Admission: EM | Admit: 2020-04-06 | Discharge: 2020-04-07 | Discharge status: Against Medical Advice | Payer: Self-pay | Attending: Emergency Medicine | Admitting: Emergency Medicine

## 2020-04-06 ENCOUNTER — Other Ambulatory Visit: Payer: Self-pay

## 2020-04-06 ENCOUNTER — Encounter (HOSPITAL_COMMUNITY): Payer: Self-pay

## 2020-04-06 NOTE — ED Notes (Signed)
Pt called X 5. No answer. Informed by Phlebotomy that pt left. Pt will be moved OTF.

## 2020-04-06 NOTE — ED Notes (Signed)
Pt called X 2 to go back to hallway bed. No answer. Informed Technical sales engineer.

## 2020-04-06 NOTE — ED Triage Notes (Signed)
Patient had wart removed from posterior surface of foot yesterday in ED. States that she left and didn't get boot nor crutches post procedure, here for supplies

## 2021-07-15 ENCOUNTER — Other Ambulatory Visit: Payer: Self-pay | Admitting: Family Medicine

## 2021-07-15 DIAGNOSIS — Z1231 Encounter for screening mammogram for malignant neoplasm of breast: Secondary | ICD-10-CM

## 2021-07-30 ENCOUNTER — Ambulatory Visit
Admission: RE | Admit: 2021-07-30 | Discharge: 2021-07-30 | Disposition: A | Discharge status: Home / Self Care | Payer: BC Managed Care – PPO | Source: Ambulatory Visit | Attending: Family Medicine | Admitting: Family Medicine

## 2021-07-30 DIAGNOSIS — Z1231 Encounter for screening mammogram for malignant neoplasm of breast: Secondary | ICD-10-CM

## 2021-10-21 ENCOUNTER — Encounter: Payer: Self-pay | Admitting: Podiatry

## 2021-10-21 ENCOUNTER — Ambulatory Visit (INDEPENDENT_AMBULATORY_CARE_PROVIDER_SITE_OTHER): Payer: BC Managed Care – PPO | Admitting: Podiatry

## 2021-10-21 DIAGNOSIS — N763 Subacute and chronic vulvitis: Secondary | ICD-10-CM | POA: Insufficient documentation

## 2021-10-21 DIAGNOSIS — M7741 Metatarsalgia, right foot: Secondary | ICD-10-CM

## 2021-10-21 DIAGNOSIS — H9312 Tinnitus, left ear: Secondary | ICD-10-CM | POA: Insufficient documentation

## 2021-10-21 DIAGNOSIS — L84 Corns and callosities: Secondary | ICD-10-CM

## 2021-10-21 DIAGNOSIS — N926 Irregular menstruation, unspecified: Secondary | ICD-10-CM | POA: Insufficient documentation

## 2021-10-21 DIAGNOSIS — M79671 Pain in right foot: Secondary | ICD-10-CM

## 2021-10-21 DIAGNOSIS — E559 Vitamin D deficiency, unspecified: Secondary | ICD-10-CM | POA: Insufficient documentation

## 2021-10-21 DIAGNOSIS — J309 Allergic rhinitis, unspecified: Secondary | ICD-10-CM | POA: Insufficient documentation

## 2021-10-21 DIAGNOSIS — M21619 Bunion of unspecified foot: Secondary | ICD-10-CM

## 2021-10-21 DIAGNOSIS — J45909 Unspecified asthma, uncomplicated: Secondary | ICD-10-CM | POA: Insufficient documentation

## 2021-10-21 DIAGNOSIS — J4520 Mild intermittent asthma, uncomplicated: Secondary | ICD-10-CM | POA: Insufficient documentation

## 2021-10-21 DIAGNOSIS — L680 Hirsutism: Secondary | ICD-10-CM | POA: Insufficient documentation

## 2021-10-21 DIAGNOSIS — M79672 Pain in left foot: Secondary | ICD-10-CM

## 2021-10-21 DIAGNOSIS — M7742 Metatarsalgia, left foot: Secondary | ICD-10-CM

## 2021-10-21 NOTE — Progress Notes (Signed)
Subjective:  ? ?Patient ID: Diane Griffin, female   DOB: 46 y.o.   MRN: [id number]  ? ?HPI ?46 year old female presents today for concerns of thick, painful calluses, warts on both of her feet on the left side x3 right side x1.  There is very tender to touch.  She states that this been ongoing for quite some time.  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 No other concerns today.  No swelling redness or any drainage.  She has no other ? ? ?Review of Systems  ?All other systems reviewed and are negative. ? ?Past Medical History:  ?Diagnosis Date  ? Polycystic ovarian syndrome   ? ? ?Past Surgical History:  ?Procedure Laterality Date  ? BREAST REDUCTION SURGERY    ? REDUCTION MAMMAPLASTY    ? ? ? ?Current Outpatient Medications:  ?  nystatin-triamcinolone ointment (MYCOLOG), 1 application to affected area, Disp: , Rfl:  ?  cyclobenzaprine (FLEXERIL) 10 MG tablet, Take 1 tablet (10 mg total) by mouth 2 (two) times daily as needed for muscle spasms., Disp: 20 tablet, Rfl: 0 ?  HYDROcodone-acetaminophen (NORCO) 5-325 MG tablet, Take 1 tablet by mouth every 6 (six) hours as needed for severe pain., Disp: 20 tablet, Rfl: 0 ?  ibuprofen (ADVIL,MOTRIN) 200 MG tablet, Take 200 mg by mouth every 6 (six) hours as needed., Disp: , Rfl:  ?  naproxen (NAPROSYN) 500 MG tablet, Take 1 tablet (500 mg total) by mouth 2 (two) times daily as needed for mild pain, moderate pain or headache (TAKE WITH MEALS.)., Disp: 20 tablet, Rfl: 0 ?  Vitamin D, Ergocalciferol, (DRISDOL) 1.25 MG (50000 UNIT) CAPS capsule, Take 50,000 Units by mouth once a week., Disp: , Rfl:  ? ?No Known Allergies ? ? ? ? ?   ?Objective:  ?Physical Exam  ?General: AAO x3, NAD ? ?Dermatological: Thick hyperkeratotic lesions present left foot submetatarsal 2, heel as well as the area just distal to the heel plantarly.  On the right side submetatarsal 5 cas well as medial hallux bilaterally.  There is no underlying ulceration drainage or signs of infection.    ? ?Vascular: Dorsalis Pedis artery and Posterior Tibial artery pedal pulses are 2/4 bilateral with immedate capillary fill time.  There is no pain with calf compression, swelling, warmth, erythema.  ? ?Neruologic: Grossly intact via light touch bilateral.  ? ?Musculoskeletal: Bunions present bilaterally.   Tenderness the hyperkeratotic lesions.  No other areas of discomfort.  Prominent metatarsal heads.  Muscular strength 5/5 in all groups tested bilateral. ? ?Gait: Unassisted, Nonantalgic.  ? ? ?   ?Assessment:  ? ?Symptomatic hyperkeratotic lesions , metatarsalgia ? ?   ?Plan:  ?Sharply debrided the hyperkeratotic lesions  x6 without any complications or bleeding.  Recommended compound cream through [facility] to help with the calluses and I ordered this today and this will complete salicylic acid as well as urea.  She is also to follow-up with [facility] for orthotics to help with offloading. ? ?Trula Slade DPM ? ?   ? ?

## 2021-10-27 ENCOUNTER — Other Ambulatory Visit: Payer: BC Managed Care – PPO

## 2021-10-29 ENCOUNTER — Other Ambulatory Visit: Payer: BC Managed Care – PPO

## 2022-02-09 ENCOUNTER — Ambulatory Visit: Payer: BC Managed Care – PPO | Admitting: Podiatry

## 2022-02-09 ENCOUNTER — Ambulatory Visit (INDEPENDENT_AMBULATORY_CARE_PROVIDER_SITE_OTHER): Payer: BC Managed Care – PPO

## 2022-02-09 DIAGNOSIS — L602 Onychogryphosis: Secondary | ICD-10-CM

## 2022-02-09 DIAGNOSIS — M7741 Metatarsalgia, right foot: Secondary | ICD-10-CM

## 2022-02-09 DIAGNOSIS — L84 Corns and callosities: Secondary | ICD-10-CM

## 2022-02-09 DIAGNOSIS — M7742 Metatarsalgia, left foot: Secondary | ICD-10-CM

## 2022-02-09 DIAGNOSIS — M21619 Bunion of unspecified foot: Secondary | ICD-10-CM

## 2022-02-09 MED ORDER — UREA 40 % EX CREA: 1.0000 | TOPICAL_CREAM | Freq: Every day | CUTANEOUS | 0 refills | Status: AC

## 2022-02-09 NOTE — Progress Notes (Signed)
Subjective: Chief Complaint  Patient presents with   Callouses    Bilateral callus and nail trim     46 year old female presents the office today with concerns with painful calluses on the bottoms of both of her feet which are painful with walking.  Denies any swelling or redness or any drainage.  Also her nails are thickened elongated and she is having difficulty trimming them.  No swelling or redness to the toenail sites.  No open lesions.  No other concerns.  Objective: AAO x3, NAD DP/PT pulses palpable bilaterally, CRT less than 3 seconds Bunions are present resulting in hyperkeratotic lesions on the hallux as well as submetatarsal area bilaterally.  Thicker lesions are submetatarsal 2.  Also has hyperkeratotic lesions on bilateral heels.  There is no ongoing ulceration drainage or signs of infection. Nails are elongated but not incurvation of the nail borders on the hallux toenails.  Mild discoloration with yellow discoloration.  No edema, erythema or signs of infection. No pain with calf compression, swelling, warmth, erythema  Assessment: Hyperkeratotic lesions due to bunion, biomechanical changes  Plan: -All treatment options discussed with the patient including all alternatives, risks, complications.  -X-rays were obtained reviewed bilaterally.  3 views were obtained.  Moderate bunions present.  There is no evidence of acute fracture. -I did sharply debride the lesions x6 without any complications or bleeding.  Ultimately discussed urea to help with the calluses as well as offloading.  We also discussed surgical intervention.  For now she is in continue with conservative care with offloading shoes consider surgical intervention. -As a courtesy debride the nails with any complications or bleeding. -Patient encouraged to call the office with any questions, concerns, change in symptoms.   Trula Slade DPM

## 2022-03-01 ENCOUNTER — Telehealth: Payer: Self-pay | Admitting: Podiatry

## 2022-03-01 NOTE — Telephone Encounter (Signed)
LEFT MESSAGE FOR [facility]. NEED TO SCHEDULE AN APPOINTMENT

## 2022-03-30 ENCOUNTER — Ambulatory Visit: Payer: BC Managed Care – PPO | Admitting: Podiatry

## 2022-06-10 ENCOUNTER — Ambulatory Visit (INDEPENDENT_AMBULATORY_CARE_PROVIDER_SITE_OTHER): Payer: BC Managed Care – PPO | Admitting: Podiatry

## 2022-06-10 DIAGNOSIS — M7742 Metatarsalgia, left foot: Secondary | ICD-10-CM | POA: Diagnosis not present

## 2022-06-10 DIAGNOSIS — M779 Enthesopathy, unspecified: Secondary | ICD-10-CM

## 2022-06-10 DIAGNOSIS — M21619 Bunion of unspecified foot: Secondary | ICD-10-CM | POA: Diagnosis not present

## 2022-06-10 DIAGNOSIS — M7741 Metatarsalgia, right foot: Secondary | ICD-10-CM | POA: Diagnosis not present

## 2022-06-12 NOTE — Progress Notes (Signed)
Subjective: Chief Complaint  Patient presents with   Callouses    Routine foot care, nail trim and callus, patient did pick up her orthotics today    46 year old female presents the office today with concerns with painful calluses on the bottoms of both of her feet which are painful with walking.  She presents today to pick up orthotics as well.  No recent injuries or changes otherwise.  No open lesions.  No other concerns.  Objective: AAO x3, NAD DP/PT pulses palpable bilaterally, CRT less than 3 seconds Bunions are present resulting in hyperkeratotic lesions on the hallux as well as submetatarsal area bilaterally.  Thicker lesions are submetatarsal 2.  Dry skin present to the heels.  Minimal hyperkeratotic tissue.  There is no open lesions.  There is no drainage or pus.   No pain with calf compression, swelling, warmth, erythema  Assessment: Hyperkeratotic lesions due to bunion, biomechanical changes  Plan: -All treatment options discussed with the patient including all alternatives, risks, complications.  -As a courtesy debrided the callus without any complications or bleeding.  Recommended using a pumice stone, moisturizer on a regular basis.  Orthotics were dispensed and break-in instructions were provided to the patient.  If she needs any adjustments to let me know.  Trula Slade DPM

## 2022-11-06 IMAGING — MG MM DIGITAL SCREENING BILAT W/ TOMO AND CAD
8 series · 8 of 24 positions shown · non-contrast
Comparison: None.

CLINICAL DATA: Screening.

EXAM:
DIGITAL SCREENING BILATERAL MAMMOGRAM WITH TOMOSYNTHESIS AND CAD
TECHNIQUE: Bilateral screening digital craniocaudal and mediolateral oblique
mammograms were obtained. Bilateral screening digital breast
tomosynthesis was performed. The images were evaluated with
computer-aided detection.

[L CC synth-2D]
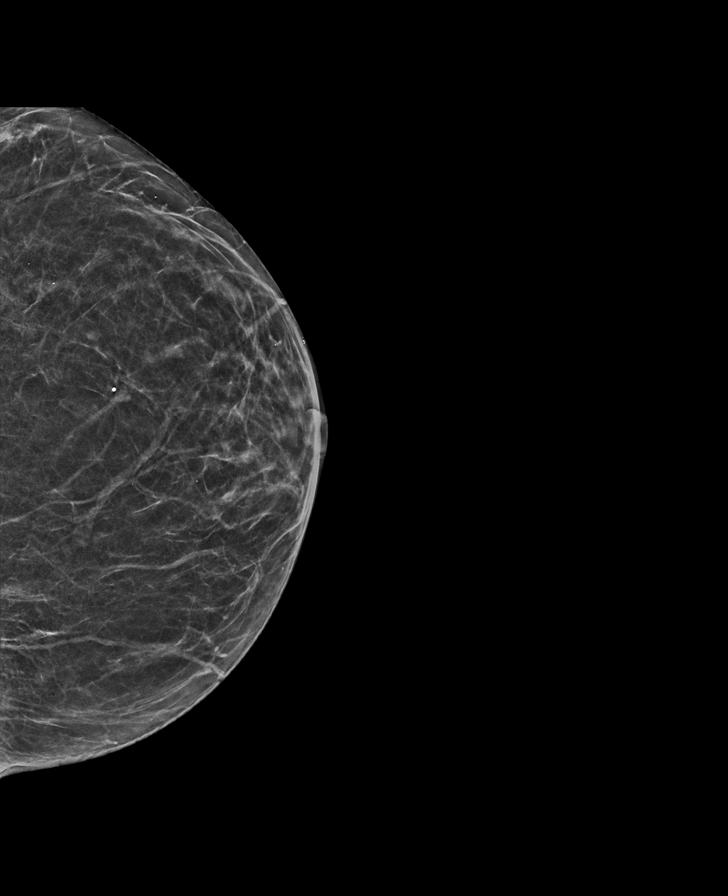

[R MLO synth-2D]
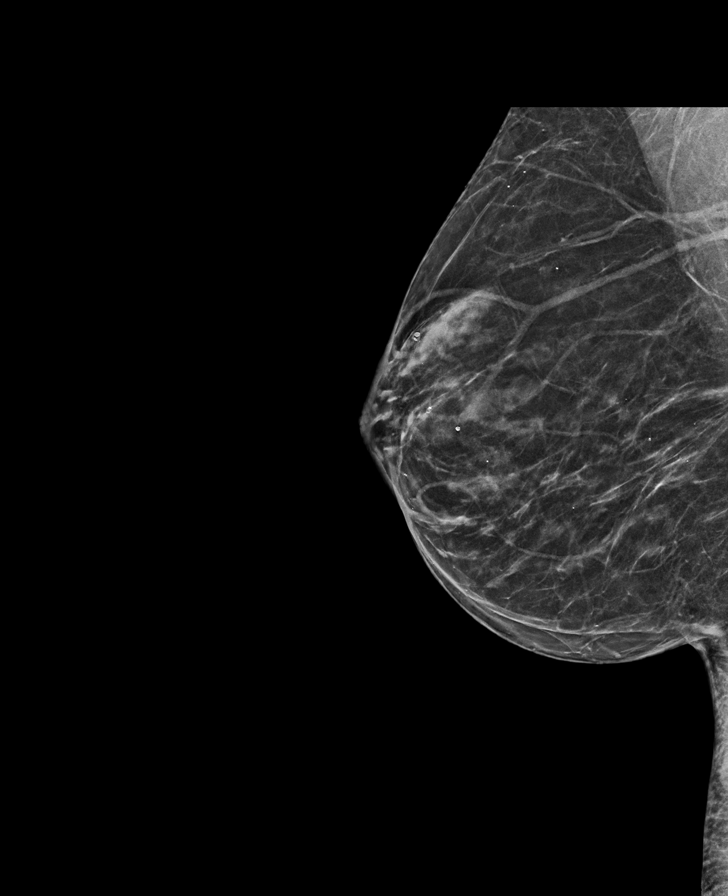

[R CC synth-2D]
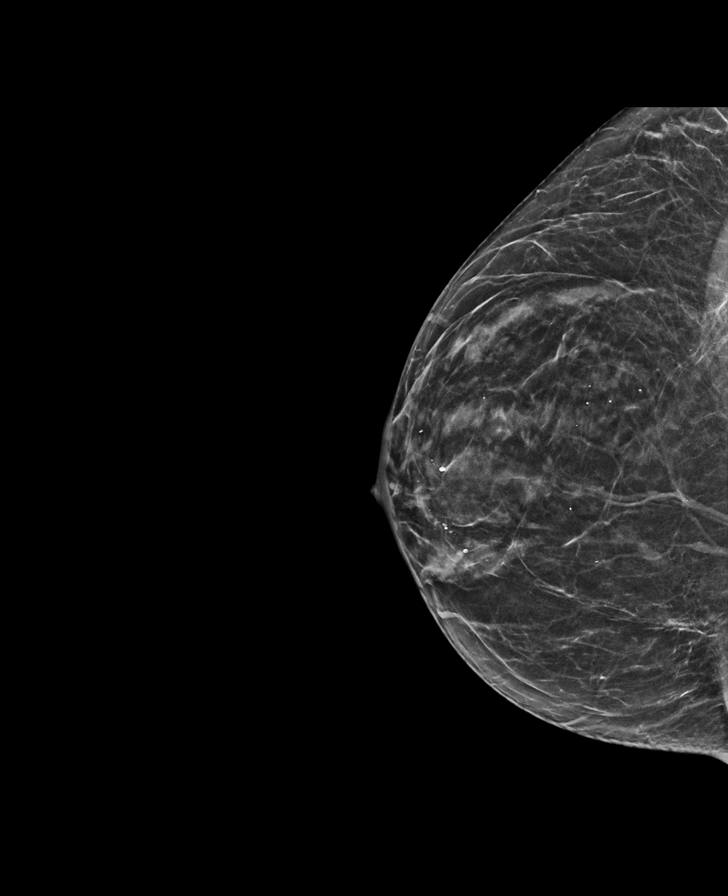

[L MLO synth-2D]
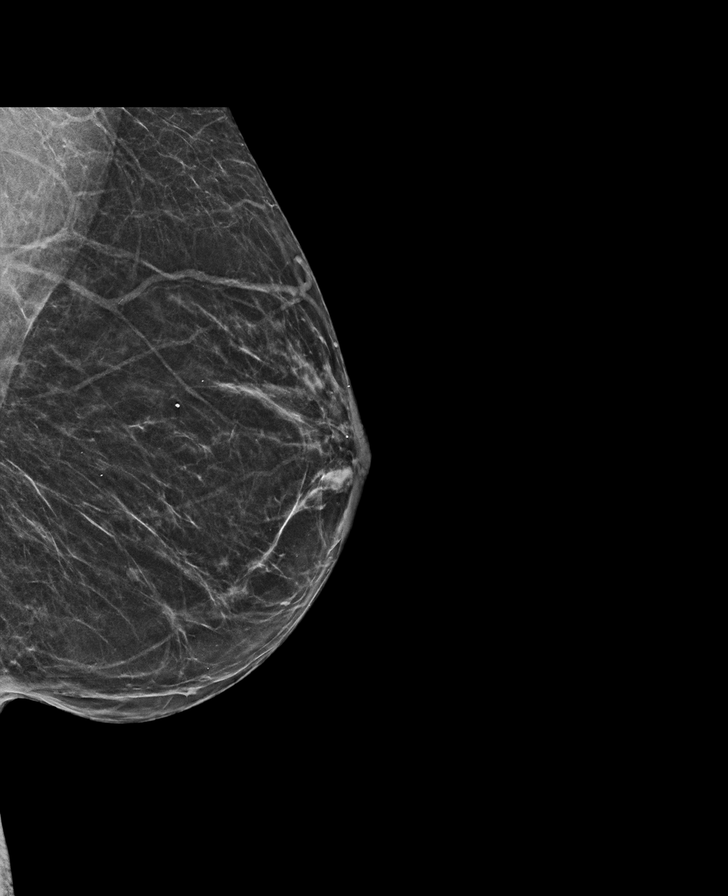

[L CC tomo · tomo slice 27/52.0]
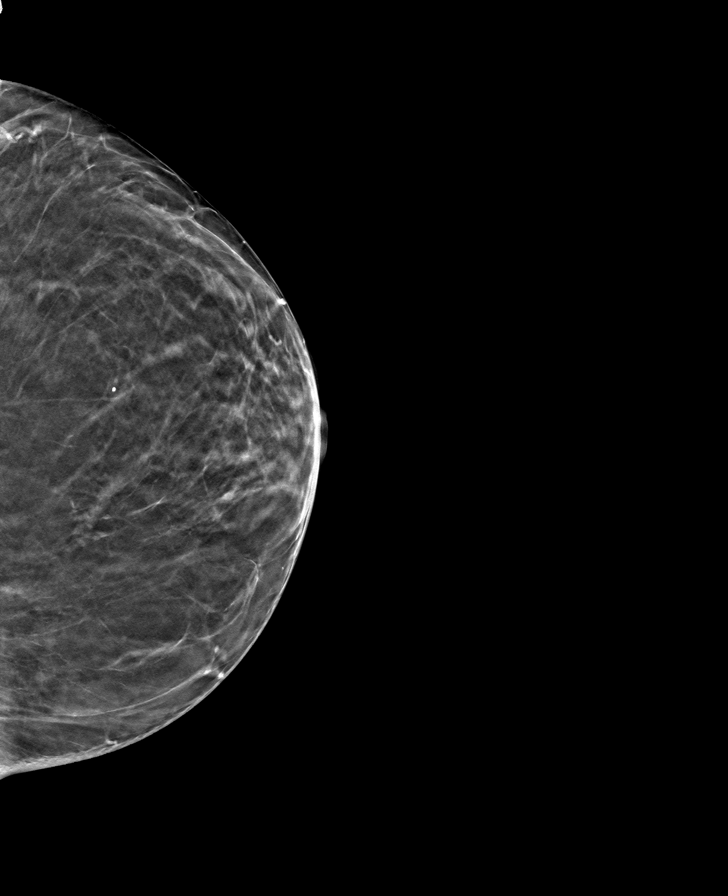

[L MLO tomo · tomo slice 27/53.0]
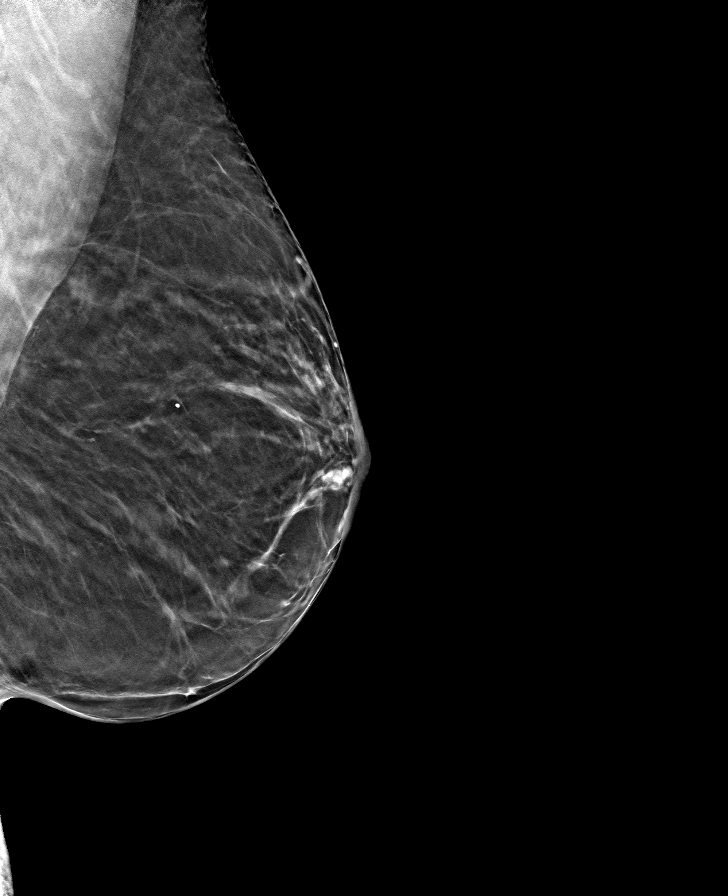

[R CC tomo · tomo slice 29/56.0]
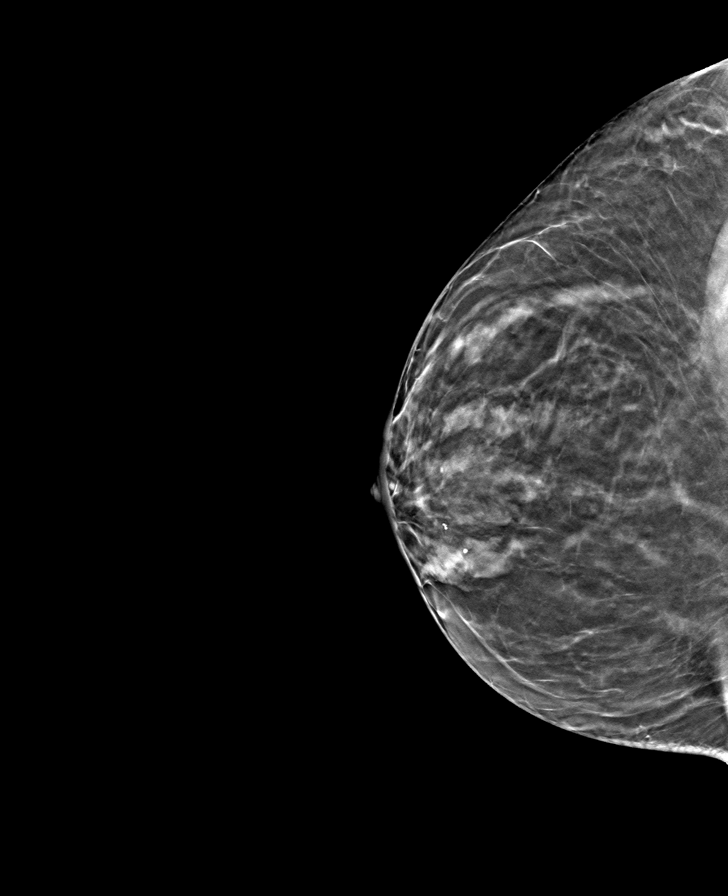

[R MLO tomo · tomo slice 27/54.0]
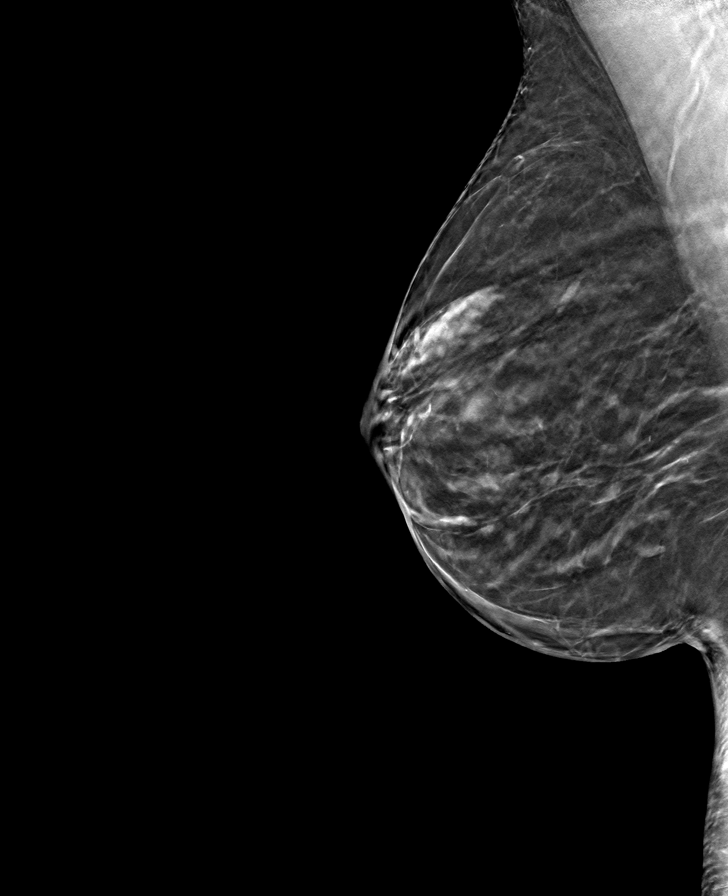

[8 of 24 positions shown; findings below may reference images not displayed]

ACR Breast Density Category b: There are scattered areas of
fibroglandular density.
FINDINGS: There are no findings suspicious for malignancy.
IMPRESSION: No mammographic evidence of malignancy. A result letter of this
screening mammogram will be mailed directly to the patient.

RECOMMENDATION:
Screening mammogram in one year. (Code:XG-X-X7B)

BI-RADS CATEGORY  1: Negative.
